# Patient Record
Sex: Female | Born: 2000
Health system: Southern US, Community
[De-identification: ages and names within clinical notes are randomized; demographics above are authoritative.]

## PROBLEM LIST (undated history)

## (undated) DIAGNOSIS — K5900 Constipation, unspecified: Secondary | ICD-10-CM

## (undated) HISTORY — PX: WISDOM TOOTH EXTRACTION: SHX21

---
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
## 2020-03-04 ENCOUNTER — Encounter: Payer: Self-pay | Admitting: *Deleted

## 2020-03-04 ENCOUNTER — Ambulatory Visit (INDEPENDENT_AMBULATORY_CARE_PROVIDER_SITE_OTHER): Payer: BC Managed Care – PPO | Admitting: *Deleted

## 2020-03-04 ENCOUNTER — Other Ambulatory Visit: Payer: Self-pay

## 2020-03-04 VITALS — BP 103/55 | HR 66

## 2020-03-04 DIAGNOSIS — Z3042 Encounter for surveillance of injectable contraceptive: Secondary | ICD-10-CM

## 2020-03-04 MED ORDER — MEDROXYPROGESTERONE ACETATE 150 MG/ML IM SUSP
150.0000 mg | Freq: Once | INTRAMUSCULAR | Status: AC
  Administered 2020-03-04: 150 mg via INTRAMUSCULAR

## 2020-03-04 NOTE — Progress Notes (Signed)
Chart reviewed for nurse visit. Agree with plan of care.   Venia Carbon I, NP 03/04/2020 1:28 PM

## 2020-03-04 NOTE — Progress Notes (Signed)
Depo Provera 150 mg IM administered as scheduled. Pt tolerated well. Next injection due 9/13-9/27. Pt advised she is past due for annual gyn exam (last was 03/14/18). Pt voiced understanding and agreed to schedule appt.

## 2020-04-03 ENCOUNTER — Ambulatory Visit (INDEPENDENT_AMBULATORY_CARE_PROVIDER_SITE_OTHER): Payer: BC Managed Care – PPO | Admitting: Obstetrics and Gynecology

## 2020-04-03 ENCOUNTER — Other Ambulatory Visit (HOSPITAL_COMMUNITY)
Admission: RE | Admit: 2020-04-03 | Discharge: 2020-04-03 | Disposition: A | Discharge status: Home / Self Care | Payer: BC Managed Care – PPO | Source: Ambulatory Visit | Attending: Obstetrics and Gynecology | Admitting: Obstetrics and Gynecology

## 2020-04-03 ENCOUNTER — Other Ambulatory Visit: Payer: Self-pay

## 2020-04-03 ENCOUNTER — Encounter: Payer: Self-pay | Admitting: Obstetrics and Gynecology

## 2020-04-03 VITALS — BP 113/78 | HR 96 | Wt 105.4 lb

## 2020-04-03 DIAGNOSIS — Z01419 Encounter for gynecological examination (general) (routine) without abnormal findings: Secondary | ICD-10-CM | POA: Diagnosis not present

## 2020-04-03 NOTE — Progress Notes (Signed)
GYNECOLOGY ANNUAL PREVENTATIVE CARE ENCOUNTER NOTE  History:     Rachael Strong is a 19 y.o. G0P0000 female here for a routine annual gynecologic exam.  Current complaints: Pt desires STD testing, not currently in a relationship.   Denies abnormal vaginal bleeding, discharge, pelvic pain, problems with intercourse or other gynecologic concerns.  Pt only notes occasional spotting with her depo provera   Gynecologic History No LMP recorded. Patient has had an injection. Contraception: Depo-Provera injections Last Pap: n/a.   Obstetric History OB History  Gravida Para Term Preterm AB Living  0 0 0 0 0 0  SAB TAB Ectopic Multiple Live Births  0 0 0 0 0    Past Medical History:  Diagnosis Date  . Constipation     Past Surgical History:  Procedure Laterality Date  . WISDOM TOOTH EXTRACTION      Current Outpatient Medications on File Prior to Visit  Medication Sig Dispense Refill  . ibuprofen (ADVIL,MOTRIN) 200 MG tablet Take 200 mg by mouth once as needed. For swelling     No current facility-administered medications on file prior to visit.    No Known Allergies  Social History:  reports that she is a non-smoker but has been exposed to tobacco smoke. She has never used smokeless tobacco. She reports previous drug use. Drug: Marijuana. She reports that she does not drink alcohol.  Family History  Problem Relation Age of Onset  . Hypertension Mother     The following portions of the patient's history were reviewed and updated as appropriate: allergies, current medications, past family history, past medical history, past social history, past surgical history and problem list.  Review of Systems Pertinent items noted in HPI and remainder of comprehensive ROS otherwise negative.  Physical Exam:  BP 113/78   Pulse 96   Wt 105 lb 6.4 oz (47.8 kg)  CONSTITUTIONAL: Well-developed, well-nourished female in no acute distress.  HENT:  Normocephalic, atraumatic, External  right and left ear normal. Oropharynx is clear and moist EYES: Conjunctivae and EOM are normal. Pupils are equal, round, and reactive to light. No scleral icterus.  NECK: Normal range of motion, supple, no masses.  Normal thyroid.  SKIN: Skin is warm and dry. No rash noted. Not diaphoretic. No erythema. No pallor. MUSCULOSKELETAL: Normal range of motion. No tenderness.  No cyanosis, clubbing, or edema.  2+ distal pulses. NEUROLOGIC: Alert and oriented to person, place, and time. Normal reflexes, muscle tone coordination.  PSYCHIATRIC: Normal mood and affect. Normal behavior. Normal judgment and thought content. CARDIOVASCULAR: Normal heart rate noted, regular rhythm RESPIRATORY: Clear to auscultation bilaterally. Effort and breath sounds normal, no problems with respiration noted. BREASTS: Symmetric in size. No masses, tenderness, skin changes, nipple drainage, or lymphadenopathy bilaterally. Performed in the presence of a chaperone. ABDOMEN: Soft, no distention noted.  No tenderness, rebound or guarding.  PELVIC: Normal appearing external genitalia and urethral meatus; normal appearing vaginal mucosa and cervix.  No abnormal discharge noted.   Normal uterine size, no other palpable masses, no uterine or adnexal tenderness.  Performed in the presence of a chaperone.   Assessment and Plan:    1. Well woman exam with routine gynecological exam Normal annual exam, pt wishes to continue depo-provera, next dose 05/2020 - Cervicovaginal ancillary only( [place]) - HIV Antibody (routine testing w rflx) - Hepatitis B Surface AntiGEN - Hepatitis C Antibody - RPR   Routine preventative health maintenance measures emphasized. Please refer to After Visit Summary for other counseling  recommendations.      Mariel Aloe, MD, FACOG Obstetrician & Gynecologist, [facility] for [facility], [facility]

## 2020-04-03 NOTE — Patient Instructions (Signed)
Hormonal Contraception Information Hormonal contraception is a type of birth control that uses hormones to prevent pregnancy. It usually involves a combination of the hormones estrogen and progesterone or only the hormone progesterone. Hormonal contraception works in these ways:  It thickens the mucus in the cervix, making it harder for sperm to enter the uterus.  It changes the lining of the uterus, making it harder for an egg to implant.  It may stop the ovaries from releasing eggs (ovulation). Some women who take hormonal contraceptives that contain only progesterone may continue to ovulate. Hormonal contraception cannot prevent sexually transmitted infections (STIs). Pregnancy may still occur. Estrogen and progesterone contraceptives Contraceptives that use a combination of estrogen and progesterone are available in these forms:  Pill. Pills come in different combinations of hormones. They must be taken at the same time each day. Pills can affect your period, causing you to get your period once every three months or not at all.  Patch. The patch must be worn on the lower abdomen for three weeks and then removed on the fourth.  Vaginal ring. The ring is placed in the vagina and left there for three weeks. It is then removed for one week. Progesterone contraceptives Contraceptives that use progesterone only are available in these forms:  Pill. Pills should be taken every day of the cycle.  Intrauterine device (IUD). This device is inserted into the uterus and removed or replaced every five years or sooner.  Implant. Plastic rods are placed under the skin of the upper arm. They are removed or replaced every three years or sooner.  Injection. The injection is given once every 90 days. What are the side effects? The side effects of estrogen and progesterone contraceptives include:  Nausea.  Headaches.  Breast tenderness.  Bleeding or spotting between menstrual cycles.  High blood  pressure (rare).  Strokes, heart attacks, or blood clots (rare) Side effects of progesterone-only contraceptives include:  Nausea.  Headaches.  Breast tenderness.  Unpredictable menstrual bleeding.  High blood pressure (rare). Talk to your health care provider about what side effects may affect you. Where to find more information  Ask your health care provider for more information and resources about hormonal contraception.  U.S. Department of Health and Human Services Office on Women's Health: www.womenshealth.gov Questions to ask:  What type of hormonal contraception is right for me?  How long should I plan to use hormonal contraception?  What are the side effects of the hormonal contraception method I choose?  How can I prevent STIs while using hormonal contraception? Contact a health care provider if:  You start taking hormonal contraceptives and you develop persistent or severe side effects. Summary  Estrogen and progesterone are hormones used in many forms of birth control.  Talk to your health care provider about what side effects may affect you.  Hormonal contraception cannot prevent sexually transmitted infections (STIs).  Ask your health care provider for more information and resources about hormonal contraception. This information is not intended to replace advice given to you by your health care provider. Make sure you discuss any questions you have with your health care provider. Document Revised: 12/19/2018 Document Reviewed: 07/24/2016 Elsevier Patient Education  2020 Elsevier Inc.  

## 2020-04-04 LAB — CERVICOVAGINAL ANCILLARY ONLY
Bacterial Vaginitis (gardnerella): NEGATIVE
Candida Glabrata: NEGATIVE
Candida Vaginitis: NEGATIVE
Chlamydia: NEGATIVE
Comment: NEGATIVE
Comment: NEGATIVE
Comment: NEGATIVE
Comment: NEGATIVE
Comment: NEGATIVE
Comment: NORMAL
Neisseria Gonorrhea: NEGATIVE
Trichomonas: NEGATIVE

## 2020-04-04 LAB — RPR: RPR Ser Ql: NONREACTIVE

## 2020-04-04 LAB — HIV ANTIBODY (ROUTINE TESTING W REFLEX): HIV Screen 4th Generation wRfx: NONREACTIVE

## 2020-04-04 LAB — HEPATITIS C ANTIBODY: Hep C Virus Ab: <0.1 s/co ratio (ref 0.0–0.9)

## 2020-04-04 LAB — HEPATITIS B SURFACE ANTIGEN: Hepatitis B Surface Ag: NEGATIVE

## 2020-05-20 ENCOUNTER — Ambulatory Visit (INDEPENDENT_AMBULATORY_CARE_PROVIDER_SITE_OTHER): Payer: BC Managed Care – PPO | Admitting: *Deleted

## 2020-05-20 ENCOUNTER — Other Ambulatory Visit: Payer: Self-pay

## 2020-05-20 VITALS — BP 97/70 | HR 71 | Ht 62.0 in | Wt 104.9 lb

## 2020-05-20 DIAGNOSIS — Z3042 Encounter for surveillance of injectable contraceptive: Secondary | ICD-10-CM | POA: Diagnosis not present

## 2020-05-20 DIAGNOSIS — F418 Other specified anxiety disorders: Secondary | ICD-10-CM

## 2020-05-20 MED ORDER — MEDROXYPROGESTERONE ACETATE 150 MG/ML IM SUSP
150.0000 mg | Freq: Once | INTRAMUSCULAR | Status: AC
  Administered 2020-05-20: 150 mg via INTRAMUSCULAR

## 2020-05-20 NOTE — Progress Notes (Signed)
Depo provera 150 mg IM administered as scheduled. Pt tolerated well. Next injection due 11/29-12/13. Pt had elevated GAD-7 score. She was offered [facility] appt and agreed - will schedule @ check out.

## 2020-05-21 NOTE — Progress Notes (Signed)
Chart reviewed for nurse visit. Agree with plan of care.   Dacota Ruben Lorraine, CNM 05/21/2020 6:32 PM   

## 2020-05-30 NOTE — BH Specialist Note (Signed)
Integrated Behavioral Health via Telemedicine Phone Visit  05/30/2020 Rachael Strong [id number]  Number of Integrated Behavioral Health visits: 1 Session Start time: 8:17  Session End time: 9:05 Total time: 48 minutes  Referring Provider: Luna Kitchens, CNM Type of Service: Individual Patient/Family location: Home [facility] Provider location: Center for [facility] at [facility]  All persons participating in visit:Patient Rachael Strong and [facility] Rachael Strong     I connected with Rachael Strong  by a video enabled telemedicine application (Caregility) and verified that I am speaking with the correct person using two identifiers.   Discussed confidentiality: Yes   Confirmed demographics & insurance:  Yes   I discussed that engaging in this virtual visit, they consent to the provision of behavioral healthcare and the services will be billed under their insurance.   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  Duration of problem: About 2 years; Severity of problem: moderate  STRENGTHS (Protective Factors/Coping Skills): Good social support via friends and family; future-oriented goals  ASSESSMENT: Patient currently experiencing Adjustment disorder with mixed anxiety and depression.    GOALS ADDRESSED: Patient will: 1.  Reduce symptoms of: anxiety, depression and stress  2.  Increase knowledge and/or ability of: self-management skills and stress reduction  3.  Demonstrate ability to: Increase healthy adjustment to current life circumstances   Progress of  Goals: Ongoing  INTERVENTIONS: Interventions utilized:  Solution-Focused Strategies, Mindfulness or Management consultant and Psychoeducation and/or Health Education Standardized Assessments completed & reviewed: PHQ9/GAD7 given in past two weeks   OUTCOME: Patient Response: Pt agrees to treatment plan   PLAN: 1. Follow up with behavioral health clinician on : Two weeks 2. Behavioral recommendations:  -CALM relaxation breathing exercise twice daily(morning; at bedtime) -Begin Worry Time strategy today; set phone alarm as reminder daily -Consider apps as additional self-care, as needed -Consider establishing care with new PCP (check insurance website) -Consider applying for applicable community resources (EBT, etc.) 3. Referral(s): Integrated [employer] (In Clinic)  I discussed the assessment and treatment plan with the patient and/or parent/guardian. They were provided an opportunity to ask questions and all were answered. They agreed with the plan and demonstrated an understanding of the instructions.   They were advised to call back or seek an in-person evaluation as appropriate.  I discussed that the purpose of this visit is to provide behavioral health care while limiting exposure to the novel coronavirus.  Discussed there is a possibility of technology failure and discussed alternative modes of communication if that failure occurs.  Rachael Strong    Depression screen [facility] 2/9 05/20/2020 04/03/2020 03/04/2020 03/14/2018 06/01/2015  Decreased Interest 1 0 0 2 0  Down, Depressed, Hopeless 1 0 0 2 0  PHQ - 2 Score 2 0 0 4 0  Altered sleeping 0 0 0 2 -  Tired, decreased energy 1 1 1 3  -  Change in appetite 1 0 1 2 -  Feeling bad or failure about yourself  1 0 0 1 -  Trouble concentrating 0 0 0 0 -  Moving slowly or fidgety/restless 0 0 0 0 -  Suicidal thoughts 0 0 0 1 -  PHQ-9 Score  5 1 2 13  -   GAD 7 : Generalized Anxiety Score 05/20/2020 04/03/2020 03/04/2020  03/14/2018  Nervous, Anxious, on Edge 3 1 3 2   Control/stop worrying 3 1 3 2   Worry too much - different things 3 1 1 3   Trouble relaxing 3 1 0 2  Restless 1 0 0 0  Easily annoyed or irritable 1 0 1 3  Afraid - awful might happen 2 2 3  0  Total GAD 7 Score 16 6 11  12

## 2020-05-31 ENCOUNTER — Ambulatory Visit (INDEPENDENT_AMBULATORY_CARE_PROVIDER_SITE_OTHER): Payer: BC Managed Care – PPO | Admitting: Clinical

## 2020-05-31 ENCOUNTER — Other Ambulatory Visit: Payer: Self-pay

## 2020-05-31 DIAGNOSIS — F4323 Adjustment disorder with mixed anxiety and depressed mood: Secondary | ICD-10-CM

## 2020-05-31 DIAGNOSIS — F418 Other specified anxiety disorders: Secondary | ICD-10-CM

## 2020-05-31 NOTE — Patient Instructions (Signed)
[facility] at [facility] [address] [place], [place] [address] [phone] (main office) [phone] (Alton Bouknight's office)  /Emotional Wellbeing Apps and Websites Here are a few free apps meant to help you to help yourself.  To find, try searching on the internet to see if the app is offered on Apple/Android devices. If your first choice doesn't come up on your device, the good news is that there are many choices! Play around with different apps to see which ones are helpful to you.    Calm This is an app meant to help increase calm feelings. Includes info, strategies, and tools for tracking your feelings.      Calm Harm  This app is meant to help with self-harm. Provides many 5-minute or 15-min coping strategies for doing instead of hurting yourself.       Healthy Minds Health Minds is a problem-solving tool to help deal with emotions and cope with stress you encounter wherever you are.      MindShift This app can help people cope with anxiety. Rather than trying to avoid anxiety, you can make an important shift and face it.      MY3  MY3 features a support system, safety plan and resources with the goal of offering a tool to use in a time of need.       My Life My Voice  This mood journal offers a simple solution for tracking your thoughts, feelings and moods. Animated emoticons can help identify your mood.       Relax Melodies Designed to help with sleep, on this app you can mix sounds and meditations for relaxation.      Smiling Mind Smiling Mind is meditation made easy: it's a simple tool that helps put a smile on your mind.        Stop, Breathe & Think  A friendly, simple guide for people through meditations for mindfulness and compassion.  Stop, Breathe and Think Kids Enter your current feelings and choose a "mission" to help you cope. Offers videos for certain moods instead of just sound recordings.       Team  Orange The goal of this tool is to help teens change how they think, act, and react. This app helps you focus on your own good feelings and experiences.      The Virtual Hope Box The Virtual Hope Box (VHB) contains simple tools to help patients with coping, relaxation, distraction, and positive thinking.     

## 2020-06-18 NOTE — BH Specialist Note (Addendum)
Integrated Behavioral Health via Telemedicine Video (Caregility) Visit  06/18/2020 [name] [id number]  Number of Integrated Behavioral Health visits: 2 Session Start time: 3:48  Session End time: 4:19 Total time: 31 minutes  Referring Provider: Luna Kitchens, CNM Type of Service: Individual Patient/Family location: Home [facility] Provider location: Center for [facility]  All persons participating in visit: Patient Rachael Strong and [facility] Robertt Buda     I connected with Rachael Strong by a video enabled telemedicine application (Caregility) and verified that I am speaking with the correct person using two identifiers.   Discussed confidentiality: at previous visit  Confirmed demographics & insurance:  Yes   I discussed that engaging in this virtual visit, they consent to the provision of behavioral healthcare and the services will be billed under their insurance.   Patient and/or legal guardian expressed understanding and consented to virtual visit: Yes   PRESENTING CONCERNS: Patient and/or family reports the following symptoms/concerns: Pt states her primary concern today is having two panic attacks in the past two weeks, triggered by interpersonal conflict; relaxation breathing and making daily to-do lists are helping to prevent escalation of anxiety;  journaling and walking also helping to manage emotions. Pt open to medication for anxiety.  Duration of problem: about two years; Severity of problem: moderate  STRENGTHS (Protective Factors/Coping Skills): Good social support via friends and family; future-oriented goals; open to treatment  ASSESSMENT: Patient currently experiencing Adjustment disorder with mixed anxiety and depression.    GOALS ADDRESSED: Patient will: 1.  Reduce symptoms of: anxiety, depression and stress  2.  Increase knowledge and/or ability of: self-management skills and stress reduction  3.   Demonstrate ability to: Increase healthy adjustment to current life circumstances and Increase motivation to adhere to plan of care   Progress of Goals: Ongoing  INTERVENTIONS: Interventions utilized:  Mindfulness or Management consultant and Psychoeducation and/or Health Education Standardized Assessments completed & reviewed: Not Needed   OUTCOME: Patient Response: Pt agrees to revised treatment plan; relaxation breathing exercise is helping; worry time strategy finds not helpful.    PLAN: 1. Follow up with behavioral health clinician on : Two weeks  2. Behavioral recommendations:  -Continue using CALM relaxation breathing exercise twice daily(morning; at bedtime) -Continue using additional self-coping strategies that are helping (Journaling, walking outdoors, daily lists) -Practice grounding strategy (at least once this week when not feeling anxious as practice) -Contact potential Primary Care offices to establish care with a new PCP who takes your insurance -Continue to consider applying for applicable community resources (EBT, etc.) 3. Referral(s): Integrated [employer] (In Clinic)  I discussed the assessment and treatment plan with the patient and/or parent/guardian. They were provided an opportunity to ask questions and all were answered. They agreed with the plan and demonstrated an understanding of the instructions.   They were advised to call back or seek an in-person evaluation as appropriate.  I discussed that the purpose of this visit is to provide behavioral health care while limiting exposure to the novel coronavirus.  Discussed there is a possibility of technology failure and discussed alternative modes of communication if that failure occurs.  Rachael Strong  Depression screen [facility] 2/9 05/20/2020 04/03/2020 03/04/2020 03/14/2018 06/01/2015  Decreased Interest 1 0 0 2 0  Down, Depressed, Hopeless 1 0 0 2 0  PHQ - 2 Score 2 0 0 4 0  Altered sleeping 0 0 0 2  -  Tired, decreased energy 1 1 1 3  -  Change in appetite 1 0 1 2 -  Feeling bad or failure about yourself  1 0 0 1 -  Trouble concentrating 0 0 0 0 -  Moving slowly or fidgety/restless 0 0 0 0 -  Suicidal thoughts 0 0 0 1 -  PHQ-9 Score 5 1 2 13  -   GAD 7 : Generalized Anxiety Score 05/20/2020 04/03/2020 03/04/2020 03/14/2018  Nervous, Anxious, on Edge 3 1 3 2   Control/stop worrying 3 1 3 2   Worry too much - different things 3 1 1 3   Trouble relaxing 3 1 0 2  Restless 1 0 0 0  Easily annoyed or irritable 1 0 1 3  Afraid - awful might happen 2 2 3  0  Total GAD 7 Score 16 6 11  12

## 2020-06-19 ENCOUNTER — Ambulatory Visit (INDEPENDENT_AMBULATORY_CARE_PROVIDER_SITE_OTHER): Payer: BC Managed Care – PPO | Admitting: Clinical

## 2020-06-19 ENCOUNTER — Other Ambulatory Visit: Payer: Self-pay

## 2020-06-19 DIAGNOSIS — F4323 Adjustment disorder with mixed anxiety and depressed mood: Secondary | ICD-10-CM

## 2020-06-19 NOTE — Patient Instructions (Signed)
[facility] at [facility] [address] [place], [place] [address] [phone] (main office) [phone] (Aakash Hollomon's office)   

## 2020-06-21 ENCOUNTER — Other Ambulatory Visit: Payer: Self-pay | Admitting: Student

## 2020-06-21 MED ORDER — HYDROXYZINE HCL 25 MG PO TABS: 25.0000 mg | ORAL_TABLET | Freq: Three times a day (TID) | ORAL | 1 refills | Status: DC | PRN

## 2020-06-24 ENCOUNTER — Other Ambulatory Visit: Payer: Self-pay | Admitting: Student

## 2020-06-24 MED ORDER — HYDROXYZINE PAMOATE 25 MG PO CAPS: 25.0000 mg | ORAL_CAPSULE | Freq: Three times a day (TID) | ORAL | 1 refills | Status: DC | PRN

## 2020-06-24 NOTE — BH Specialist Note (Deleted)
Integrated Behavioral Health via Telemedicine Video (Caregility) Visit  06/24/2020 Rachael Strong [id number]  Number of Integrated Behavioral Health visits: 3 Session Start time: 3:45***  Session End time: 4:15*** Total time: {IBH Total Time:21014050} minutes  Referring Provider: Luna Kitchens, CNM Type of Service: Individual, Family, *** Patient/Family location: Home [facility] Provider location: Center for [employer] at [facility] for Women  All persons participating in visit: Patient *** and Rachael Strong ***    I connected with Rachael Strong and/or Rachael Strong's {family members:20773} by a video enabled telemedicine application (Caregility) and verified that I am speaking with the correct person using two identifiers.   Discussed confidentiality: {YES/NO:21197}  Confirmed demographics & insurance:  {YES/NO:21197}  I discussed that engaging in this virtual visit, they consent to the provision of behavioral healthcare and the services will be billed under their insurance.   Patient and/or legal guardian expressed understanding and consented to virtual visit: {YES/NO:21197}  PRESENTING CONCERNS: Patient and/or family reports the following symptoms/concerns: *** Duration of problem: ***; Severity of problem: {Mild/Moderate/Severe:20260}  STRENGTHS (Protective Factors/Coping Skills): {CHL AMB BH PROTECTIVE FACTORS/STRENGTHS:[phone]}  ASSESSMENT: Patient currently experiencing ***.    GOALS ADDRESSED: Patient will: 1.  Reduce symptoms of: {IBH Symptoms:21014056}  2.  Increase knowledge and/or ability of: {IBH Patient Tools:21014057}  3.  Demonstrate ability to: {IBH Goals:21014053}   Progress of Goals: {CHL AMB BH PROGRESS TOWARDS GBTDV:[id number]}  INTERVENTIONS: Interventions utilized:  {IBH Interventions:21014054} Standardized Assessments completed & reviewed: {IBH Screening Tools:21014051}   OUTCOME: Patient Response:  ***   PLAN: 1. Follow up with behavioral health clinician on : *** 2. Behavioral recommendations: *** 3. Referral(s): {IBH Referrals:21014055}  I discussed the assessment and treatment plan with the patient and/or parent/guardian. They were provided an opportunity to ask questions and all were answered. They agreed with the plan and demonstrated an understanding of the instructions.   They were advised to call back or seek an in-person evaluation as appropriate.  I discussed that the purpose of this visit is to provide behavioral health care while limiting exposure to the novel coronavirus.  Discussed there is a possibility of technology failure and discussed alternative modes of communication if that failure occurs.  Valetta Close Onie Hayashi

## 2020-07-03 ENCOUNTER — Telehealth: Payer: Self-pay | Admitting: Clinical

## 2020-07-03 NOTE — Telephone Encounter (Cosign Needed)
Left HIPPA-compliant message to call back Asher Muir from Center for [employer] at [facility] at [phone] (main office) to reschedule today's appointment.  Bea Graff (Supervisor: Hulda Marin)

## 2020-08-05 ENCOUNTER — Ambulatory Visit (INDEPENDENT_AMBULATORY_CARE_PROVIDER_SITE_OTHER): Payer: PRIVATE HEALTH INSURANCE | Admitting: Obstetrics and Gynecology

## 2020-08-05 ENCOUNTER — Other Ambulatory Visit: Payer: Self-pay

## 2020-08-05 VITALS — BP 113/64 | HR 89 | Ht 62.0 in | Wt 108.0 lb

## 2020-08-05 DIAGNOSIS — F4323 Adjustment disorder with mixed anxiety and depressed mood: Secondary | ICD-10-CM

## 2020-08-05 DIAGNOSIS — F418 Other specified anxiety disorders: Secondary | ICD-10-CM

## 2020-08-05 DIAGNOSIS — Z3042 Encounter for surveillance of injectable contraceptive: Secondary | ICD-10-CM

## 2020-08-05 MED ORDER — MEDROXYPROGESTERONE ACETATE 150 MG/ML IM SUSP
150.0000 mg | Freq: Once | INTRAMUSCULAR | Status: AC
  Administered 2020-08-05: 150 mg via INTRAMUSCULAR

## 2020-08-05 NOTE — BH Specialist Note (Addendum)
Integrated Behavioral Health via Telemedicine Video (Caregility) Visit  08/05/2020 [name] [id number]  Number of Integrated Behavioral Health visits: 3 Session Start time: 1:19 Session End time: 2:04 Total time: 45  minutes  Referring Provider: Luna Kitchens, CNM Type of Service: Individual Patient/Family location: Home [facility] Provider location: Center for [facility] at [facility]  All persons participating in visit: Patient Rachael Strong and [facility] Axl Rodino    I connected with Lynett Strong and/or [employer] n/a by a video enabled telemedicine application (Caregility) and verified that I am speaking with the correct person using two identifiers.   Discussed confidentiality: at previous visit  Confirmed demographics & insurance:  Pt uncertain her Medicaid plan; thinks her mother changed it recently  I discussed that engaging in this virtual visit, they consent to the provision of behavioral healthcare and the services will be billed under their insurance.   Patient and/or legal guardian expressed understanding and consented to virtual visit: Yes   PRESENTING CONCERNS: Patient and/or family reports the following symptoms/concerns: Pt states her primary concern today is panic attacks, most recent this morning, triggered by noticing hair falling out; pt says hydroxyzine helps manage anxiety when she takes, but worries about taking " too much" and becoming dependent on medication. Pt excited to be expecting a new baby brother any day now and has received 3 out of 4 college acceptance letters.  Duration of problem: Over two years; Severity of problem: moderate   STRENGTHS (Protective Factors/Coping Skills): Good social support from family and friends, has future-oriented goals and adhering to treatment  ASSESSMENT: Patient currently experiencing Adjustment disorder with mixed anxiety and depression.    GOALS  ADDRESSED: Patient will: 1.  Reduce symptoms of: anxiety, depression and stress  2.  Increase knowledge and/or ability of: stress reduction  3.  Demonstrate ability to: Increase healthy adjustment to current life circumstances and Increase motivation to adhere to plan of care   Progress of Goals: Ongoing  INTERVENTIONS: Interventions utilized:  Solution-Focused Strategies Standardized Assessments completed & reviewed: PHQ9/GAD7 given in past two weeks   OUTCOME: Patient Response: Pt agrees to revised treatment plan   PLAN: 1. Follow up with behavioral health clinician on : One month 2. Behavioral recommendations:  -Discuss with mother about switching Medicaid plans, if needed (as [facility] does not accept [employer]) -Continue using mood app to track mood (Goal: 4/7 "good" days), relaxation breathing daily; continue writing, daily lists and outdoor walks as needed to help manage symptoms -Add daily meditation (on mood app) daily  -Continue taking Vistaril as prescribed; Discuss with PCP about taking over future refills (asap) 3. Referral(s): Integrated [employer] (In Clinic)  I discussed the assessment and treatment plan with the patient and/or parent/guardian. They were provided an opportunity to ask questions and all were answered. They agreed with the plan and demonstrated an understanding of the instructions.   They were advised to call back or seek an in-person evaluation as appropriate.  I discussed that the purpose of this visit is to provide behavioral health care while limiting exposure to the novel coronavirus.  Discussed there is a possibility of technology failure and discussed alternative modes of communication if that failure occurs.  Valetta Close [facility]  Depression screen [facility] 2/9 08/05/2020 05/20/2020 04/03/2020 03/04/2020 03/14/2018  Decreased Interest 1 1 0 0 2  Down, Depressed, Hopeless 1 1 0 0 2  PHQ - 2 Score 2 2 0 0 4  Altered sleeping 2 0 0  0 2   Tired, decreased energy 2 1 1 1 3   Change in appetite 1 1 0 1 2  Feeling bad or failure about yourself  0 1 0 0 1  Trouble concentrating 1 0 0 0 0  Moving slowly or fidgety/restless 0 0 0 0 0  Suicidal thoughts 0 0 0 0 1  PHQ-9 Score 8 5 1 2 13    GAD 7 : Generalized Anxiety Score 08/05/2020 05/20/2020 04/03/2020 03/04/2020  Nervous, Anxious, on Edge 2 3 1 3   Control/stop worrying 2 3 1 3   Worry too much - different things 2 3 1 1   Trouble relaxing 2 3 1  0  Restless 0 1 0 0  Easily annoyed or irritable 2 1 0 1  Afraid - awful might happen 2 2 2 3   Total GAD 7 Score 12 16 6  11

## 2020-08-05 NOTE — Progress Notes (Signed)
Rachael Strong here for Depo-Provera Injection. Injection administered without complication. Patient will return in 3 months for next injection between 10/21/20 and 11/04/20. Next annual visit due 04/03/21. Does have elevated gad 7; states has seen Jamie,B[facility] in past; would like to see  Her again; will make appt at checkout.   Gilbert Manolis,RN 08/05/2020  8:59 AM

## 2020-08-05 NOTE — Progress Notes (Signed)
Agree with A & P. 

## 2020-08-06 ENCOUNTER — Encounter: Payer: Self-pay | Admitting: Obstetrics and Gynecology

## 2020-08-19 ENCOUNTER — Ambulatory Visit (INDEPENDENT_AMBULATORY_CARE_PROVIDER_SITE_OTHER): Payer: 59 | Admitting: Clinical

## 2020-08-19 DIAGNOSIS — R69 Illness, unspecified: Secondary | ICD-10-CM | POA: Diagnosis not present

## 2020-08-19 DIAGNOSIS — F4323 Adjustment disorder with mixed anxiety and depressed mood: Secondary | ICD-10-CM | POA: Diagnosis not present

## 2020-08-19 DIAGNOSIS — F418 Other specified anxiety disorders: Secondary | ICD-10-CM
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
## 2020-09-17 ENCOUNTER — Ambulatory Visit: Payer: Self-pay | Admitting: Clinical

## 2020-09-17 ENCOUNTER — Other Ambulatory Visit: Payer: Self-pay

## 2020-09-17 DIAGNOSIS — F4323 Adjustment disorder with mixed anxiety and depressed mood: Secondary | ICD-10-CM

## 2020-09-17 NOTE — Patient Instructions (Signed)
[facility] at [facility] [address] [place], [place] [address] [phone] (main office) [phone] (Jayliana Valencia's office)  [facility] offices accepting new patients:   [facility] at [place] [address] [place], [place] [address] [phone]  [facility] [place] [address] [place], [place] [address] [phone]  [facility] [address] [place], [place] [address] [phone]  [facility] [address] [place], [place] [address] [phone]  [facility] [address] [place],  [place]  [address] [phone]  

## 2020-09-19 NOTE — BH Specialist Note (Signed)
Integrated Behavioral Health via Telemedicine Visit  09/19/2020 Rachael Strong [id number]  Number of Integrated Behavioral Health visits: 5 Session Start time: 1:15  Session End time: 1:44 Total time: 29  Referring Provider: Luna Kitchens, CNM Patient/Family location: Home [facility] Provider location: [facility] at [facility]  All persons participating in visit: Patient Rachael Strong and [facility] Jamie McMannes   Types of Service: Individual psychotherapy  I connected with Danaka Cartlidge and/or Irelynd Clymer's n/a by Video  (Video is Caregility application) and verified that I am speaking with the correct person using two identifiers.Discussed confidentiality: Yes   I discussed the limitations of telemedicine and the availability of in person appointments.  Discussed there is a possibility of technology failure and discussed alternative modes of communication if that failure occurs.  I discussed that engaging in this telemedicine visit, they consent to the provision of behavioral healthcare and the services will be billed under their insurance.  Patient and/or legal guardian expressed understanding and consented to Telemedicine visit: Yes   Presenting Concerns: Patient and/or family reports the following symptoms/concerns: Pt states her primary concern today is feeling "stuck, like I'm behind" in life experiences, while waiting last college acceptance letter/potential scholarship; also having difficulty finding PCP. Pt is taking Vistaril daily and requests additional refill, until able to get established with new PCP.  Duration of problem: Over two years; Severity of problem: moderate  Patient and/or Family's Strengths/Protective Factors: Social connections, Sense of purpose and Future-oriented goals and adhering to treatment   Goals Addressed: Patient will: 1.  Reduce symptoms of: anxiety, depression and stress  2.  Demonstrate  ability to: Increase healthy adjustment to current life circumstances  Progress towards Goals: Ongoing  Interventions: Interventions utilized:  Solution-Focused Strategies Standardized Assessments completed: Not Needed  Patient and/or Family Response: Pt agrees to treatment plan  Assessment: Patient currently experiencing Adjustment disorder with mixed anxiety and depression.   Patient may benefit from continued psychoeducation and brief therapeutic interventions regarding coping with symptoms of depression, anxiety, stress .  Plan: 1. Follow up with behavioral health clinician on : Call Jamie at [phone] as needed 2. Behavioral recommendations:  -Continue taking Vistaril as prescribed -Continue plan to establish with PCP -Focus on healthy self-care and distractions daily for the next week while waiting for last acceptance letter to arrive.  -After final acceptance letter with scholarship offer arrives, give yourself a deadline to make the final decision ([employer] or [employer]). Congratulate yourself for taking this first step towards your future goals.  3. Referral(s): Integrated [employer] (In Clinic)  I discussed the assessment and treatment plan with the patient and/or parent/guardian. They were provided an opportunity to ask questions and all were answered. They agreed with the plan and demonstrated an understanding of the instructions.   They were advised to call back or seek an in-person evaluation if the symptoms worsen or if the condition fails to improve as anticipated.  Valetta Close McMannes, LCSW

## 2020-09-24 ENCOUNTER — Other Ambulatory Visit: Payer: Self-pay | Admitting: Student

## 2020-10-01 ENCOUNTER — Other Ambulatory Visit: Payer: Self-pay

## 2020-10-01 ENCOUNTER — Ambulatory Visit (INDEPENDENT_AMBULATORY_CARE_PROVIDER_SITE_OTHER): Payer: 59 | Admitting: Clinical

## 2020-10-01 DIAGNOSIS — R69 Illness, unspecified: Secondary | ICD-10-CM | POA: Diagnosis not present

## 2020-10-01 DIAGNOSIS — F4323 Adjustment disorder with mixed anxiety and depressed mood: Secondary | ICD-10-CM | POA: Diagnosis not present

## 2020-10-12 ENCOUNTER — Other Ambulatory Visit: Payer: Self-pay | Admitting: Student

## 2020-10-12 MED ORDER — HYDROXYZINE PAMOATE 25 MG PO CAPS: 25.0000 mg | ORAL_CAPSULE | Freq: Three times a day (TID) | ORAL | 1 refills | Status: AC | PRN

## 2020-10-21 ENCOUNTER — Other Ambulatory Visit: Payer: Self-pay

## 2020-10-21 ENCOUNTER — Ambulatory Visit (INDEPENDENT_AMBULATORY_CARE_PROVIDER_SITE_OTHER): Payer: Medicaid Other

## 2020-10-21 VITALS — BP 116/64 | HR 81 | Ht 62.0 in | Wt 111.5 lb

## 2020-10-21 DIAGNOSIS — Z3042 Encounter for surveillance of injectable contraceptive: Secondary | ICD-10-CM

## 2020-10-21 MED ORDER — MEDROXYPROGESTERONE ACETATE 150 MG/ML IM SUSP
150.0000 mg | Freq: Once | INTRAMUSCULAR | Status: AC
  Administered 2020-10-21: 150 mg via INTRAMUSCULAR

## 2020-10-21 NOTE — Progress Notes (Signed)
Rachael Strong here for Depo-Provera Injection. Injection administered without complication. Patient will return in 3 months for next injection between May 2 and May 16,2022. Next annual visit due July 2022.    Isabell Jarvis, RN 10/21/2020  8:32 AM

## 2020-10-22 ENCOUNTER — Telehealth: Payer: Self-pay | Admitting: Clinical

## 2020-10-22 NOTE — Telephone Encounter (Signed)
Attempt to follow up for brief check-in with pt; Left HIPPA-compliant message to call back Asher Muir from [employer] for [employer] at [facility] at [phone] [facility] office), as needed.

## 2021-01-06 ENCOUNTER — Ambulatory Visit (INDEPENDENT_AMBULATORY_CARE_PROVIDER_SITE_OTHER): Payer: 59

## 2021-01-06 ENCOUNTER — Other Ambulatory Visit: Payer: Self-pay

## 2021-01-06 VITALS — BP 107/70 | HR 75 | Wt 111.3 lb

## 2021-01-06 DIAGNOSIS — Z3042 Encounter for surveillance of injectable contraceptive: Secondary | ICD-10-CM

## 2021-01-06 MED ORDER — MEDROXYPROGESTERONE ACETATE 150 MG/ML IM SUSP
150.0000 mg | Freq: Once | INTRAMUSCULAR | Status: AC
  Administered 2021-01-06: 150 mg via INTRAMUSCULAR

## 2021-01-06 NOTE — Progress Notes (Signed)
Rachael Strong here for Depo-Provera Injection. Injection administered without complication. Patient will return in 3 months for next injection between 03/24/21-04/07/21. Next annual visit due 04/03/21. Pt states she will be attending [employer] beginning Fall 2022, discussed pt coming for annual visit before leaving for school and then following up with [facility] for regular Depo Provera injections.   Marjo Bicker, RN 01/06/2021  8:33 AM

## 2021-01-06 NOTE — Progress Notes (Signed)
Chart reviewed for nurse visit. Agree with plan of care.   Romond Pipkins Lorraine, CNM 01/06/2021 5:21 PM   

## 2021-03-24 ENCOUNTER — Other Ambulatory Visit (HOSPITAL_COMMUNITY)
Admission: RE | Admit: 2021-03-24 | Discharge: 2021-03-24 | Disposition: A | Discharge status: Home / Self Care | Payer: 59 | Source: Ambulatory Visit | Attending: Nurse Practitioner | Admitting: Nurse Practitioner

## 2021-03-24 ENCOUNTER — Encounter: Payer: Self-pay | Admitting: Nurse Practitioner

## 2021-03-24 ENCOUNTER — Other Ambulatory Visit: Payer: Self-pay

## 2021-03-24 ENCOUNTER — Ambulatory Visit (INDEPENDENT_AMBULATORY_CARE_PROVIDER_SITE_OTHER): Payer: 59 | Admitting: Nurse Practitioner

## 2021-03-24 VITALS — BP 111/62 | HR 78 | Ht 62.0 in | Wt 111.8 lb

## 2021-03-24 DIAGNOSIS — Z3042 Encounter for surveillance of injectable contraceptive: Secondary | ICD-10-CM | POA: Diagnosis not present

## 2021-03-24 DIAGNOSIS — Z01419 Encounter for gynecological examination (general) (routine) without abnormal findings: Secondary | ICD-10-CM | POA: Diagnosis not present

## 2021-03-24 MED ORDER — MEDROXYPROGESTERONE ACETATE 150 MG/ML IM SUSP
150.0000 mg | Freq: Once | INTRAMUSCULAR | Status: AC
  Administered 2021-03-24: 150 mg via INTRAMUSCULAR

## 2021-03-24 NOTE — Progress Notes (Signed)
Rachael Strong is here for an annual visit and Depo provera injection. Depo Provera 150 mg injectable administered into right upper outer quadrant without any complications. Patient will return in 3 months between October 3rd and June 23, 2021. Patient stated that she understood and did not have any questions or concerns.    Dawayne Patricia, CMA   03/24/21

## 2021-03-24 NOTE — Progress Notes (Signed)
110   GYNECOLOGY ANNUAL PREVENTATIVE CARE ENCOUNTER NOTE  Subjective:   Rachael Strong is a 20 y.o. G0P0000 female here for a routine annual gynecologic exam.  Current complaints: wants to have annual exam and stay on Depo.  Will be going to [employer] this fall.   Denies abnormal vaginal bleeding, discharge, pelvic pain, problems with intercourse or other gynecologic concerns.    Gynecologic History No LMP recorded. Patient has had an injection. Contraception: Depo-Provera injections Last Pap: NA   Obstetric History OB History  Gravida Para Term Preterm AB Living  0 0 0 0 0 0  SAB IAB Ectopic Multiple Live Births  0 0 0 0 0    Past Medical History:  Diagnosis Date   Constipation     Past Surgical History:  Procedure Laterality Date   WISDOM TOOTH EXTRACTION      Current Outpatient Medications on File Prior to Visit  Medication Sig Dispense Refill   hydrOXYzine (VISTARIL) 25 MG capsule Take 1 capsule (25 mg total) by mouth 3 (three) times daily as needed. 60 capsule 1   medroxyPROGESTERone (DEPO-PROVERA) 150 MG/ML injection Inject 150 mg into the muscle every 3 (three) months.     No current facility-administered medications on file prior to visit.    No Known Allergies  Social History   Socioeconomic History   Marital status: Single    Spouse name: Not on file   Number of children: Not on file   Years of education: Not on file   Highest education level: Not on file  Occupational History   Not on file  Tobacco Use   Smoking status: Passive Smoke Exposure - Never Smoker   Smokeless tobacco: Never  Substance and Sexual Activity   Alcohol use: Never   Drug use: Not Currently    Types: Marijuana   Sexual activity: Yes    Partners: Male    Birth control/protection: Injection, Condom  Other Topics Concern   Not on file  Social History Narrative   Not on file   Social Determinants of Health   Financial Resource Strain: Not on file  Food Insecurity:  No Food Insecurity   Worried About Running Out of Food in the Last Year: Never true   Ran Out of Food in the Last Year: Never true  Transportation Needs: No Transportation Needs   Lack of Transportation (Medical): No   Lack of Transportation (Non-Medical): No  Physical Activity: Not on file  Stress: Not on file  Social Connections: Not on file  Intimate Partner Violence: Not on file    Family History  Problem Relation Age of Onset   Hypertension Mother     The following portions of the patient's history were reviewed and updated as appropriate: allergies, current medications, past family history, past medical history, past social history, past surgical history and problem list.  Review of Systems Pertinent items noted in HPI and remainder of comprehensive ROS otherwise negative.   Objective:  BP 111/62   Pulse 78   Ht 5\' 2"  (1.575 m)   Wt 111 lb 12.8 oz (50.7 kg)   BMI 20.45 kg/m  CONSTITUTIONAL: Well-developed, well-nourished female in no acute distress.  HENT:  Normocephalic, atraumatic, External right and left ear normal.  EYES: Conjunctivae and EOM are normal. Pupils are equal, round.  No scleral icterus.  NECK: Normal range of motion, supple, no masses.  Normal thyroid.  SKIN: Skin is warm and dry. No rash noted. Not diaphoretic. No erythema. No pallor.  NEUROLOGIC: Alert and oriented to person, place, and time. Normal reflexes, muscle tone coordination. No cranial nerve deficit noted. PSYCHIATRIC: Normal mood and affect. Normal behavior. Normal judgment and thought content. CARDIOVASCULAR: Normal heart rate noted, regular rhythm RESPIRATORY: Clear to auscultation bilaterally. Effort and breath sounds normal, no problems with respiration noted. BREASTS: Symmetric in size. No masses, skin changes, nipple drainage, or lymphadenopathy. ABDOMEN: Soft, no distention noted.  No tenderness, rebound or guarding.  PELVIC: Vaginal swab only MUSCULOSKELETAL: Normal range of motion.  No tenderness.  No cyanosis, clubbing, or edema.    Assessment and Plan:  1. Encounter for annual routine gynecological examination BP normal BMI normal Non smoker  2. Encounter for surveillance of injectable contraceptive Will continue Depo every 3 months for one year. May get some injections at [facility] at college as traveling back and forth may be a problem.  - medroxyPROGESTERone (DEPO-PROVERA) injection 150 mg   Routine preventative health maintenance measures emphasized. Please refer to After Visit Summary for other counseling recommendations.    Nolene Bernheim, RN, MSN, NP-BC Nurse Practitioner, [facility] for [facility]

## 2021-03-25 LAB — CERVICOVAGINAL ANCILLARY ONLY
Chlamydia: NEGATIVE
Comment: NEGATIVE
Comment: NORMAL
Neisseria Gonorrhea: NEGATIVE

## 2021-06-04 DIAGNOSIS — Z20828 Contact with and (suspected) exposure to other viral communicable diseases: Secondary | ICD-10-CM | POA: Diagnosis not present

## 2021-06-13 DIAGNOSIS — Z3042 Encounter for surveillance of injectable contraceptive: Secondary | ICD-10-CM | POA: Diagnosis not present

## 2021-06-24 ENCOUNTER — Ambulatory Visit: Payer: 59

## 2021-06-27 ENCOUNTER — Ambulatory Visit
Admission: EM | Admit: 2021-06-27 | Discharge: 2021-06-27 | Disposition: A | Discharge status: Home / Self Care | Payer: 59

## 2021-06-27 ENCOUNTER — Other Ambulatory Visit: Payer: Self-pay

## 2021-06-27 ENCOUNTER — Ambulatory Visit (INDEPENDENT_AMBULATORY_CARE_PROVIDER_SITE_OTHER): Payer: 59

## 2021-06-27 DIAGNOSIS — R059 Cough, unspecified: Secondary | ICD-10-CM | POA: Diagnosis not present

## 2021-06-27 DIAGNOSIS — R053 Chronic cough: Secondary | ICD-10-CM

## 2021-06-27 MED ORDER — PREDNISONE 10 MG (21) PO TBPK: ORAL_TABLET | Freq: Every day | ORAL | 0 refills | Status: AC

## 2021-06-27 NOTE — Discharge Instructions (Addendum)
Your chest x-ray was normal.  You have been prescribed prednisone steroid to decrease inflammation in chest.  Please go the hospital if shortness of breath develops.  Follow-up with primary care or urgent care if symptoms persist.

## 2021-06-27 NOTE — ED Provider Notes (Signed)
[facility]    CSN: [id number] Arrival date & time: 06/27/21  1033      History   Chief Complaint Chief Complaint  Patient presents with   Cough    HPI Rachael Strong is a 20 y.o. female.   Patient presents with 3 to 4-week history of nonproductive cough.  Patient has been evaluated multiple times by her campus health care providers.  Has been prescribed Tessalon Perles, codeine cough syrup, Mucinex, Sudafed, DayQuil, antihistamines with no relief in cough.  Patient reports that she was also prescribed amoxicillin antibiotic 2 weeks ago which showed improvement but not completely resolved.  Patient was also prescribed budesonide inhaler with no improvement in symptoms.  Patient denies any current shortness of breath but states that she does have shortness of breath with exertion.  Denies any chest pain.  Denies any known fevers or sick contacts.  Patient was tested for COVID-19 at beginning of symptoms and tested negative.  Patient is requesting chest x-ray.   Cough  Past Medical History:  Diagnosis Date   Constipation     There are no problems to display for this patient.   Past Surgical History:  Procedure Laterality Date   WISDOM TOOTH EXTRACTION      OB History     Gravida  0   Para  0   Term  0   Preterm  0   AB  0   Living  0      SAB  0   IAB  0   Ectopic  0   Multiple  0   Live Births  0            Home Medications    Prior to Admission medications   Medication Sig Start Date End Date Taking? Authorizing Provider  budesonide-formoterol (SYMBICORT) 80-4.5 MCG/ACT inhaler Inhale 2 puffs into the lungs 2 (two) times daily.   Yes [provider]  medroxyPROGESTERone (DEPO-PROVERA) 150 MG/ML injection Inject 150 mg into the muscle every 3 (three) months.   Yes [provider]  predniSONE (STERAPRED UNI-PAK 21 TAB) 10 MG (21) TBPK tablet Take by mouth daily. Take 6 tabs by mouth daily  for 2 days, then 5  tabs for 2 days, then 4 tabs for 2 days, then 3 tabs for 2 days, 2 tabs for 2 days, then 1 tab by mouth daily for 2 days 06/27/21  Yes Araf Clugston, [place] E, FNP  hydrOXYzine (VISTARIL) 25 MG capsule Take 1 capsule (25 mg total) by mouth 3 (three) times daily as needed. 10/12/20   Marylene Land, CNM    Family History Family History  Problem Relation Age of Onset   Hypertension Mother     Social History Social History   Tobacco Use   Smoking status: Passive Smoke Exposure - Never Smoker   Smokeless tobacco: Never  Substance Use Topics   Alcohol use: Never   Drug use: Not Currently    Types: Marijuana     Allergies   Patient has no known allergies.   Review of Systems Review of Systems Per HPI  Physical Exam Triage Vital Signs ED Triage Vitals  Enc Vitals Group     BP 06/27/21 1236 (!) 102/59     Pulse Rate 06/27/21 1236 85     Resp 06/27/21 1236 16     Temp 06/27/21 1236 98.7 F (37.1 C)     Temp Source 06/27/21 1236 Oral     SpO2 06/27/21 1236 98 %  Weight --      Height --      Head Circumference --      Peak Flow --      Pain Score 06/27/21 1258 0     Pain Loc --      Pain Edu? --      Excl. in GC? --    No data found.  Updated Vital Signs BP (!) 102/59 (BP Location: Left Arm)   Pulse 85   Temp 98.7 F (37.1 C) (Oral)   Resp 16   SpO2 98%   Visual Acuity Right Eye Distance:   Left Eye Distance:   Bilateral Distance:    Right Eye Near:   Left Eye Near:    Bilateral Near:     Physical Exam Constitutional:      General: She is not in acute distress.    Appearance: Normal appearance. She is not toxic-appearing or diaphoretic.  HENT:     Head: Normocephalic and atraumatic.     Right Ear: Tympanic membrane and ear canal normal.     Left Ear: Tympanic membrane and ear canal normal.     Nose: Nose normal.     Mouth/Throat:     Mouth: Mucous membranes are moist.     Pharynx: No posterior oropharyngeal erythema.  Eyes:     Extraocular  Movements: Extraocular movements intact.     Conjunctiva/sclera: Conjunctivae normal.     Pupils: Pupils are equal, round, and reactive to light.  Cardiovascular:     Rate and Rhythm: Normal rate and regular rhythm.     Pulses: Normal pulses.     Heart sounds: Normal heart sounds.  Pulmonary:     Effort: Pulmonary effort is normal. No respiratory distress.     Breath sounds: Normal breath sounds. No stridor. No wheezing, rhonchi or rales.  Skin:    General: Skin is warm and dry.  Neurological:     General: No focal deficit present.     Mental Status: She is alert and oriented to person, place, and time. Mental status is at baseline.  Psychiatric:        Mood and Affect: Mood normal.        Behavior: Behavior normal.        Thought Content: Thought content normal.        Judgment: Judgment normal.     UC Treatments / Results  Labs (all labs ordered are listed, but only abnormal results are displayed) Labs Reviewed - No data to display  EKG   Radiology DG Chest 2 View  Result Date: 06/27/2021 CLINICAL DATA:  Provided history: Persistent cough. Additional history provided: Patient reports itchy throat and cough for 3-4 weeks. EXAM: CHEST - 2 VIEW COMPARISON:  Prior chest radiographs 02/07/2012. FINDINGS: Heart size within normal limits. No appreciable airspace consolidation. No evidence of pleural effusion or pneumothorax. No acute bony abnormality identified. S-shaped curvature of the thoracolumbar spine at the imaged levels. IMPRESSION: No evidence of acute cardiopulmonary abnormality. S-shaped curvature of the thoracolumbar spine at the imaged levels. Electronically Signed   By: Jackey Loge D.O.   On: 06/27/2021 13:32    Procedures Procedures (including critical care time)  Medications Ordered in UC Medications - No data to display  Initial Impression / Assessment and Plan / UC Course  I have reviewed the triage vital signs and the nursing notes.  Pertinent labs &  imaging results that were available during my care of the patient were reviewed by me  and considered in my medical decision making (see chart for details).     Chest x-ray was negative for any acute cardiopulmonary process.  Suspect viral etiology to symptoms and possibly viral bronchitis.  Will treat with prednisone steroid taper.  No red flags on exam.  Discussed spinal curvature that was seen on x-ray with patient.Discussed strict return precautions. Patient verbalized understanding and is agreeable with plan.  Final Clinical Impressions(s) / UC Diagnoses   Final diagnoses:  Persistent cough for 3 weeks or longer     Discharge Instructions      Your chest x-ray was normal.  You have been prescribed prednisone steroid to decrease inflammation in chest.  Please go the hospital if shortness of breath develops.  Follow-up with primary care or urgent care if symptoms persist.     ED Prescriptions     Medication Sig Dispense Auth. Provider   predniSONE (STERAPRED UNI-PAK 21 TAB) 10 MG (21) TBPK tablet Take by mouth daily. Take 6 tabs by mouth daily  for 2 days, then 5 tabs for 2 days, then 4 tabs for 2 days, then 3 tabs for 2 days, 2 tabs for 2 days, then 1 tab by mouth daily for 2 days 42 tablet [place], Acie Fredrickson, [place]      PDMP not reviewed this encounter.   Gustavus Bryant, [place] 06/27/21 1348

## 2021-06-27 NOTE — ED Triage Notes (Signed)
3-4 weeks itchy throat and cough. Pt has been evaluated by [facility] twice and treated with a variety of meds (tessalon, codeine cough syrup, mucinex, sudafed, dayquil and allergy meds) all without relief.  About two weeks ago, Pt was prescribed amoxicillin with an inhaler and notes relief until one week ago when her sxs returned. One episode of post tussive emesis.

## 2021-09-03 DIAGNOSIS — Z23 Encounter for immunization: Secondary | ICD-10-CM | POA: Diagnosis not present

## 2021-09-03 DIAGNOSIS — Z3042 Encounter for surveillance of injectable contraceptive: Secondary | ICD-10-CM | POA: Diagnosis not present

## 2022-09-02 ENCOUNTER — Telehealth: Payer: Self-pay

## 2022-09-02 NOTE — Telephone Encounter (Signed)
No working #. Sending Mychart msg. AS, CMA

## 2022-12-30 IMAGING — DX DG CHEST 2V
2 series · 2 of 2 positions shown · non-contrast
Comparison: Prior chest radiographs 02/07/2012.

CLINICAL DATA: Provided history: Persistent cough. Additional
history provided: Patient reports itchy throat and cough for 3-4
weeks.

EXAM:
CHEST - 2 VIEW

[chest pa]
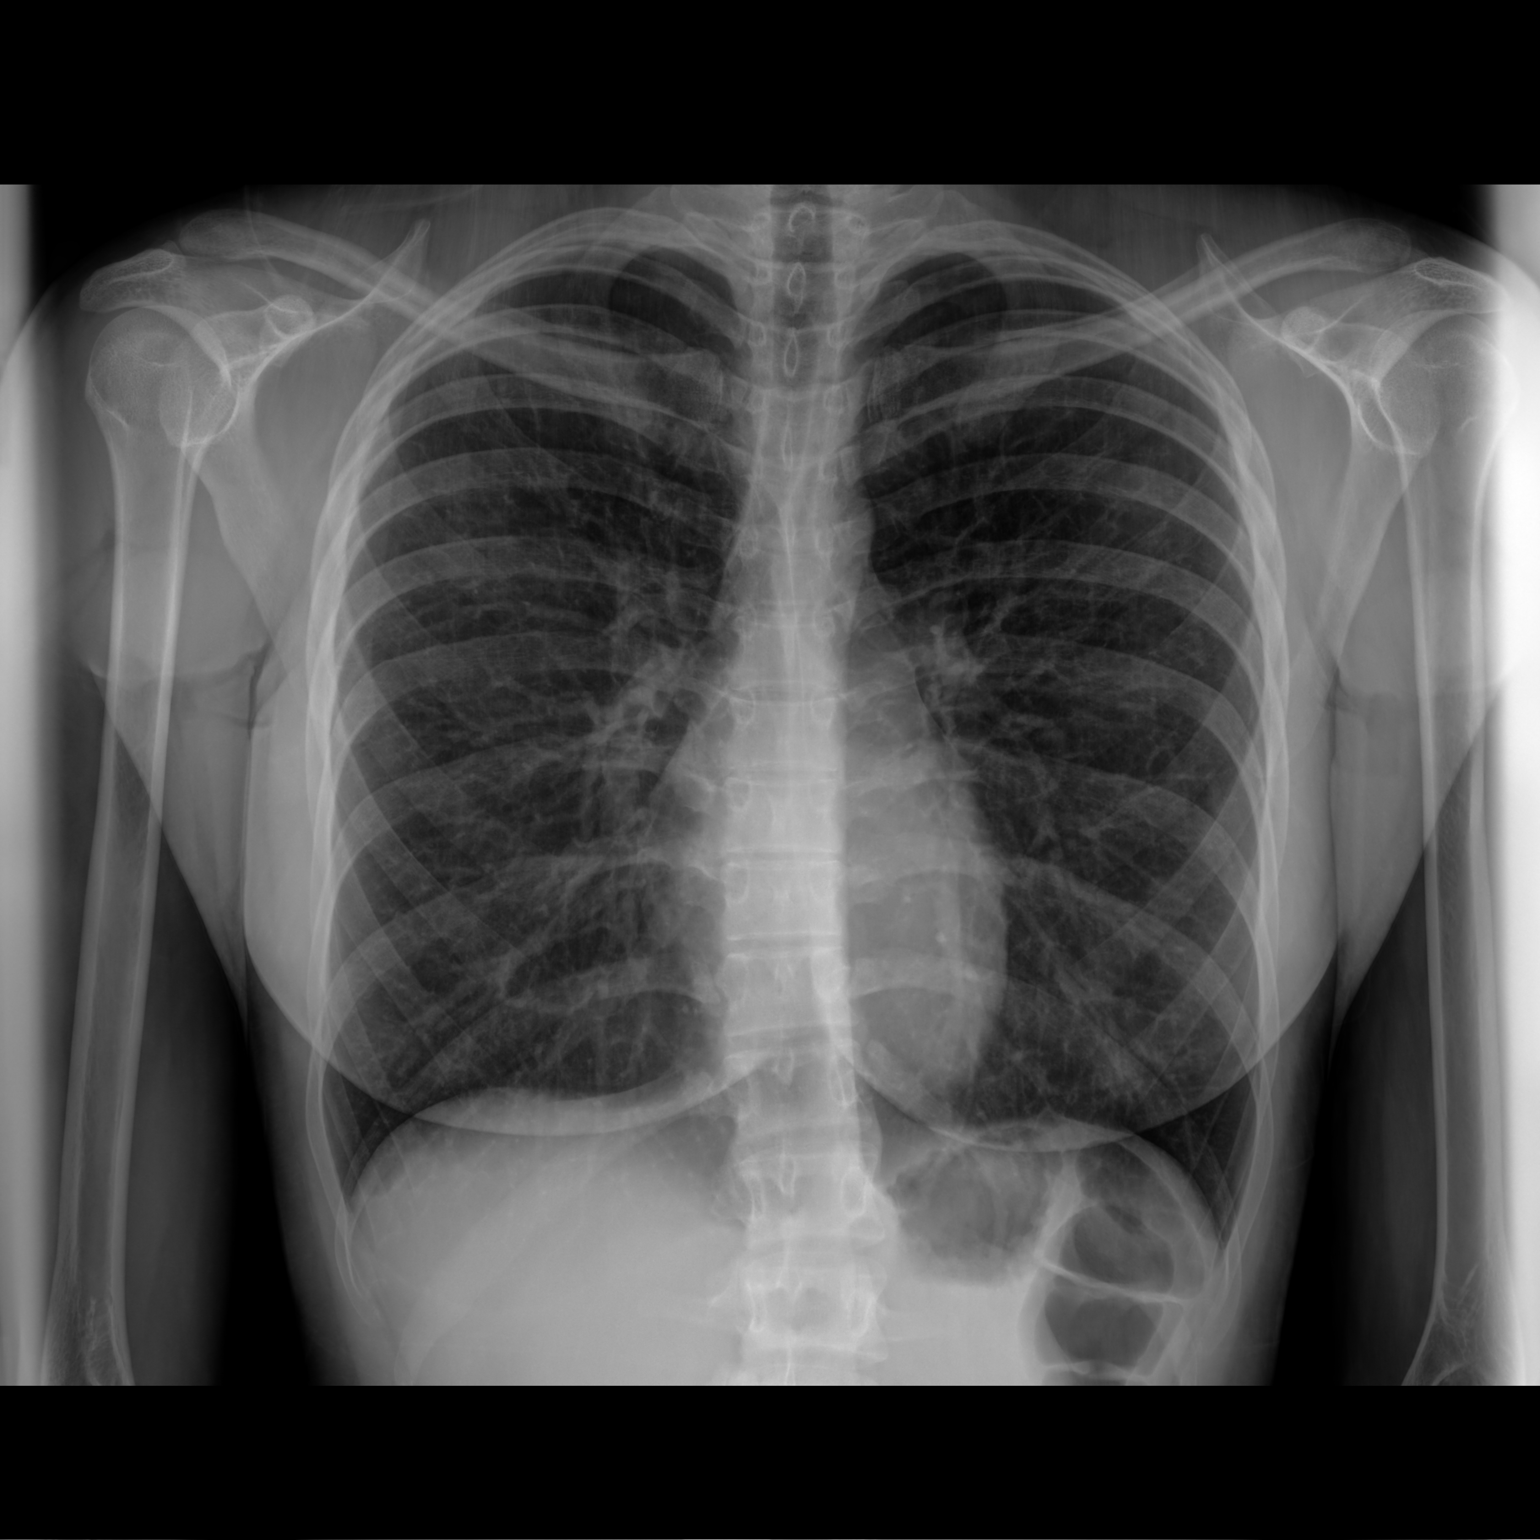

[chest lat]
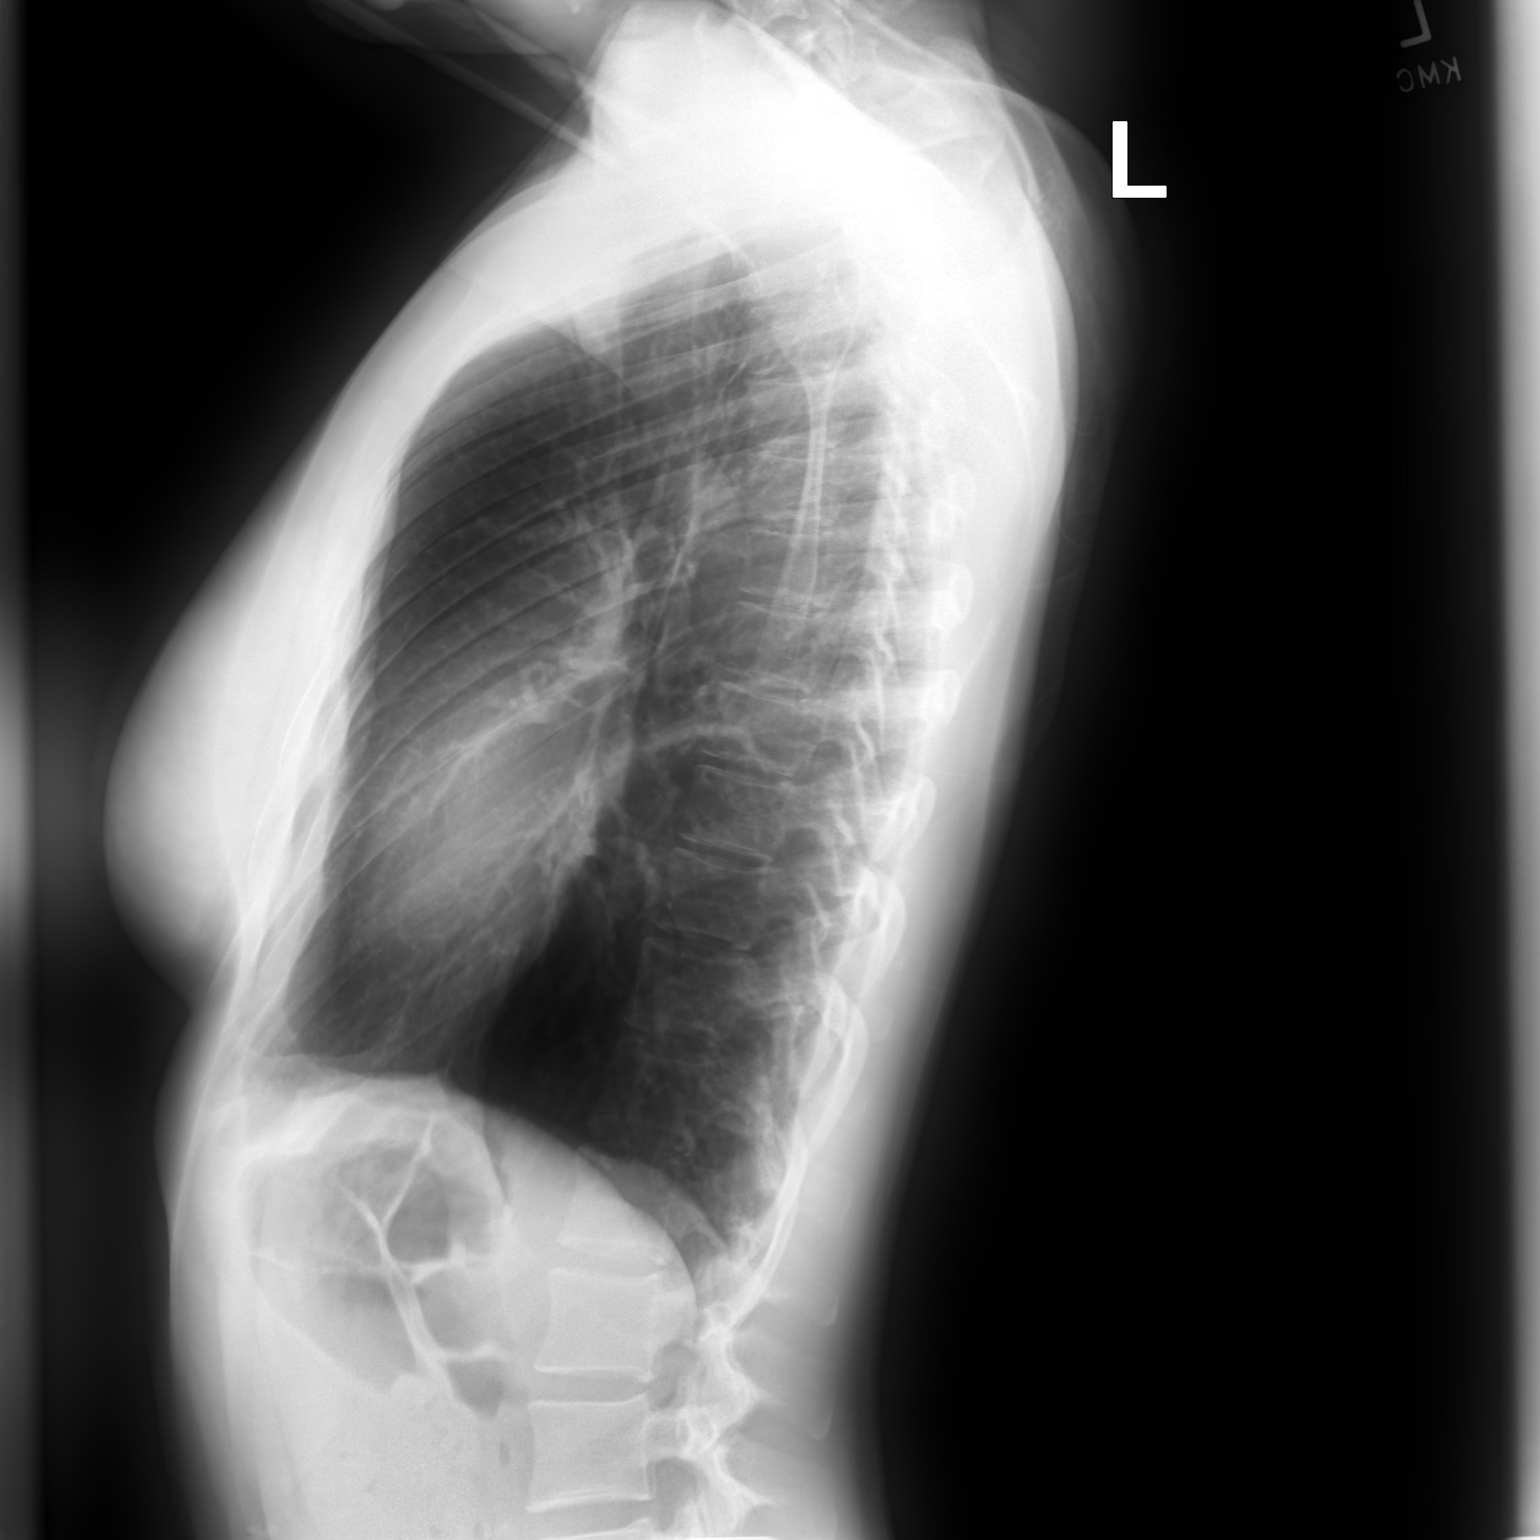

[2 of 2 positions shown; findings below may reference images not displayed]

FINDINGS: Heart size within normal limits. No appreciable airspace
consolidation. No evidence of pleural effusion or pneumothorax. No
acute bony abnormality identified. S-shaped curvature of the
thoracolumbar spine at the imaged levels.
IMPRESSION: No evidence of acute cardiopulmonary abnormality.

S-shaped curvature of the thoracolumbar spine at the imaged levels.
